# Patient Record
Sex: Female | Born: 1980 | Race: White | Hispanic: No | Marital: Single | Smoking: Never smoker
Health system: Southern US, Community
[De-identification: ages and names within clinical notes are randomized; demographics above are authoritative.]

## PROBLEM LIST (undated history)

## (undated) DIAGNOSIS — F32A Depression, unspecified: Secondary | ICD-10-CM

## (undated) DIAGNOSIS — F419 Anxiety disorder, unspecified: Secondary | ICD-10-CM

## (undated) DIAGNOSIS — F329 Major depressive disorder, single episode, unspecified: Secondary | ICD-10-CM

---

## 2016-06-23 ENCOUNTER — Encounter (HOSPITAL_BASED_OUTPATIENT_CLINIC_OR_DEPARTMENT_OTHER): Payer: Self-pay

## 2016-06-23 ENCOUNTER — Emergency Department (HOSPITAL_BASED_OUTPATIENT_CLINIC_OR_DEPARTMENT_OTHER)
Admission: EM | Admit: 2016-06-23 | Discharge: 2016-06-24 | Disposition: A | Discharge status: Home / Self Care | Payer: 59 | Attending: Emergency Medicine | Admitting: Emergency Medicine

## 2016-06-23 ENCOUNTER — Emergency Department (HOSPITAL_BASED_OUTPATIENT_CLINIC_OR_DEPARTMENT_OTHER): Payer: 59

## 2016-06-23 DIAGNOSIS — R05 Cough: Secondary | ICD-10-CM | POA: Insufficient documentation

## 2016-06-23 DIAGNOSIS — R112 Nausea with vomiting, unspecified: Secondary | ICD-10-CM

## 2016-06-23 DIAGNOSIS — R69 Illness, unspecified: Secondary | ICD-10-CM

## 2016-06-23 DIAGNOSIS — E860 Dehydration: Secondary | ICD-10-CM | POA: Diagnosis not present

## 2016-06-23 DIAGNOSIS — J111 Influenza due to unidentified influenza virus with other respiratory manifestations: Secondary | ICD-10-CM

## 2016-06-23 HISTORY — DX: Anxiety disorder, unspecified: F41.9

## 2016-06-23 HISTORY — DX: Major depressive disorder, single episode, unspecified: F32.9

## 2016-06-23 HISTORY — DX: Depression, unspecified: F32.A

## 2016-06-23 LAB — BASIC METABOLIC PANEL
ANION GAP: 9 (ref 5–15)
BUN: 18 mg/dL (ref 6–20)
CALCIUM: 8.7 mg/dL — AB (ref 8.9–10.3)
CHLORIDE: 101 mmol/L (ref 101–111)
CO2: 24 mmol/L (ref 22–32)
Creatinine, Ser: 1.25 mg/dL — ABNORMAL HIGH (ref 0.44–1.00)
GFR calc Af Amer: >60 mL/min (ref >60)
GFR calc non Af Amer: 55 mL/min — ABNORMAL LOW (ref >60)
Glucose, Bld: 150 mg/dL — ABNORMAL HIGH (ref 65–99)
Potassium: 3.7 mmol/L (ref 3.5–5.1)
Sodium: 134 mmol/L — ABNORMAL LOW (ref 135–145)

## 2016-06-23 LAB — CBC
HEMATOCRIT: 34.7 % — AB (ref 36.0–46.0)
HEMOGLOBIN: 11.7 g/dL — AB (ref 12.0–15.0)
MCH: 27.9 pg (ref 26.0–34.0)
MCHC: 33.7 g/dL (ref 30.0–36.0)
MCV: 82.8 fL (ref 78.0–100.0)
Platelets: 215 10*3/uL (ref 150–400)
RBC: 4.19 MIL/uL (ref 3.87–5.11)
RDW: 13 % (ref 11.5–15.5)
WBC: 13.7 10*3/uL — AB (ref 4.0–10.5)

## 2016-06-23 MED ORDER — OSELTAMIVIR PHOSPHATE 75 MG PO CAPS: 75.0000 mg | ORAL_CAPSULE | Freq: Two times a day (BID) | ORAL | 0 refills | Status: AC

## 2016-06-23 MED ORDER — SODIUM CHLORIDE 0.9 % IV BOLUS (SEPSIS)
1000.0000 mL | Freq: Once | INTRAVENOUS | Status: AC
  Administered 2016-06-23: 1000 mL via INTRAVENOUS

## 2016-06-23 MED ORDER — ACETAMINOPHEN 160 MG/5ML PO SOLN
650.0000 mg | Freq: Once | ORAL | Status: AC
Start: 2016-06-23 — End: 2016-06-23
  Administered 2016-06-23: 650 mg via ORAL
  Filled 2016-06-23: qty 20.3

## 2016-06-23 MED ORDER — ONDANSETRON 8 MG PO TBDP: 8.0000 mg | ORAL_TABLET | Freq: Three times a day (TID) | ORAL | 0 refills | Status: AC | PRN

## 2016-06-23 MED ORDER — ONDANSETRON 4 MG PO TBDP
4.0000 mg | ORAL_TABLET | Freq: Once | ORAL | Status: AC
  Administered 2016-06-23: 4 mg via ORAL
  Filled 2016-06-23: qty 1

## 2016-06-23 NOTE — Discharge Instructions (Signed)
Drink plenty of fluids, follow up with your primary care doctor if not better in the next week

## 2016-06-23 NOTE — ED Provider Notes (Signed)
[facility] Note   CSN: [id number] Arrival date & time: 06/23/16  2036  By signing my name below, I, Sonum Patel, attest that this documentation has been prepared under the direction and in the presence of Linwood DibblesJon Noya Santarelli, MD. Electronically Signed: Sonum Patel, Neurosurgeoncribe. 06/23/16. 10:08 PM.  History   Chief Complaint Chief Complaint  Patient presents with  . Emesis   The history is provided by the patient. No language interpreter was used.    HPI Comments: Tracy Rivers is a 36 y.o. female who presents to the Emergency Department complaining of an unchanged fever (TMAX 104.1) and chills that began earlier this evening. She notes associated cough and generalized myalgias that began earlier today. She also complains of vomiting PTA. She denies abdominal pain or diarrhea.    Past Medical History:  Diagnosis Date  . Anxiety   . Depression     There are no active problems to display for this patient.   History reviewed. No pertinent surgical history.  OB History    No data available       Home Medications    Prior to Admission medications   Medication Sig Start Date End Date Taking? Authorizing Provider  CITALOPRAM HYDROBROMIDE PO Take by mouth.   Yes Historical Provider, MD  ondansetron (ZOFRAN ODT) 8 MG disintegrating tablet Take 1 tablet (8 mg total) by mouth every 8 (eight) hours as needed for nausea or vomiting. 06/23/16   Linwood DibblesJon Tristina Sahagian, MD  oseltamivir (TAMIFLU) 75 MG capsule Take 1 capsule (75 mg total) by mouth 2 (two) times daily. 06/23/16   Linwood DibblesJon Tambi Thole, MD    Family History No family history on file.  Social History Social History  Substance Use Topics  . Smoking status: Never Smoker  . Smokeless tobacco: Never Used  . Alcohol use Yes     Comment: occ     Allergies   Mango flavor   Review of Systems Review of Systems  Constitutional: Positive for chills and fever.  HENT: Positive for congestion.   Respiratory: Positive for cough.     Gastrointestinal: Positive for vomiting. Negative for abdominal pain and diarrhea.     Physical Exam Updated Vital Signs BP 103/64 (BP Location: Left Arm)   Pulse (!) 131   Temp (!) 103.3 F (39.6 C) (Oral)   Resp 24   Ht 5' 4.75" (1.645 m)   Wt 88.5 kg   LMP 06/20/2016   SpO2 97%   BMI 32.70 kg/m   Physical Exam  Constitutional: She appears well-developed and well-nourished. No distress.  HENT:  Head: Normocephalic and atraumatic.  Right Ear: External ear normal.  Left Ear: External ear normal.  Eyes: Conjunctivae are normal. Right eye exhibits no discharge. Left eye exhibits no discharge. No scleral icterus.  Neck: Neck supple. No tracheal deviation present.  Cardiovascular: Normal rate, regular rhythm and intact distal pulses.   Pulmonary/Chest: Effort normal and breath sounds normal. No stridor. No respiratory distress. She has no wheezes. She has no rales.  Abdominal: Soft. Bowel sounds are normal. She exhibits no distension. There is no tenderness. There is no rebound and no guarding.  Musculoskeletal: She exhibits no edema or tenderness.  Neurological: She is alert. She has normal strength. No cranial nerve deficit (no facial droop, extraocular movements intact, no slurred speech) or sensory deficit. She exhibits normal muscle tone. She displays no seizure activity. Coordination normal.  Skin: Skin is warm and dry. No rash noted.  Psychiatric: She has a normal mood  and affect.  Nursing note and vitals reviewed.    ED Treatments / Results  DIAGNOSTIC STUDIES: Oxygen Saturation is 97% on RA, normal by my interpretation.    COORDINATION OF CARE: 10:07 PM Discussed treatment plan with pt at bedside and pt agreed to plan.   Labs (all labs ordered are listed, but only abnormal results are displayed) Labs Reviewed  CBC - Abnormal; Notable for the following:       Result Value   WBC 13.7 (*)    Hemoglobin 11.7 (*)    HCT 34.7 (*)    All other components within  normal limits  BASIC METABOLIC PANEL - Abnormal; Notable for the following:    Sodium 134 (*)    Glucose, Bld 150 (*)    Creatinine, Ser 1.25 (*)    Calcium 8.7 (*)    GFR calc non Af Amer 55 (*)    All other components within normal limits    Radiology Dg Chest 2 View  Result Date: 06/23/2016 CLINICAL DATA:  36 y/o  F; fever and chills. EXAM: CHEST  2 VIEW COMPARISON:  None. FINDINGS: The heart size and mediastinal contours are within normal limits. Bronchitic changes in the right lung base. No focal consolidation or pleural effusion. The visualized skeletal structures are unremarkable. IMPRESSION: Bronchitic changes in the right lung base. No focal consolidation or pleural effusion. Electronically Signed   By: Mitzi Hansen M.D.   On: 06/23/2016 22:25    Procedures Procedures (including critical care time)  Medications Ordered in ED Medications  sodium chloride 0.9 % bolus 1,000 mL (not administered)  ondansetron (ZOFRAN-ODT) disintegrating tablet 4 mg (4 mg Oral Given 06/23/16 2106)  acetaminophen (TYLENOL) solution 650 mg (650 mg Oral Given 06/23/16 2105)  sodium chloride 0.9 % bolus 1,000 mL (0 mLs Intravenous Stopped 06/23/16 2350)     Initial Impression / Assessment and Plan / ED Course  I have reviewed the triage vital signs and the nursing notes.  Pertinent labs & imaging results that were available during my care of the patient were reviewed by me and considered in my medical decision making (see chart for details).   Sx are consistent with influenza like illness.  No pna noted on cxr.  Labs do show mild AKI related to her vomiting.  Pt was given IV fluids and no further vomiting.  Dc home, supportive care.  Final Clinical Impressions(s) / ED Diagnoses   Final diagnoses:  Influenza-like illness  Nausea and vomiting, intractability of vomiting not specified, unspecified vomiting type  Dehydration    New Prescriptions New Prescriptions   ONDANSETRON  (ZOFRAN ODT) 8 MG DISINTEGRATING TABLET    Take 1 tablet (8 mg total) by mouth every 8 (eight) hours as needed for nausea or vomiting.   OSELTAMIVIR (TAMIFLU) 75 MG CAPSULE    Take 1 capsule (75 mg total) by mouth 2 (two) times daily.   I personally performed the services described in this documentation, which was scribed in my presence.  The recorded information has been reviewed and is accurate.    Linwood Dibbles, MD 06/23/16 [phone]

## 2016-06-23 NOTE — ED Triage Notes (Addendum)
C/o n/v this pm-fever and body aches this am-vomiting in triage-NAD after-steady gait

## 2017-03-18 IMAGING — CR DG CHEST 2V
2 series · 2 of 2 positions shown · non-contrast
Comparison: None.

CLINICAL DATA: 35 y/o  F; fever and chills.

EXAM:
CHEST  2 VIEW

[w chest pa]
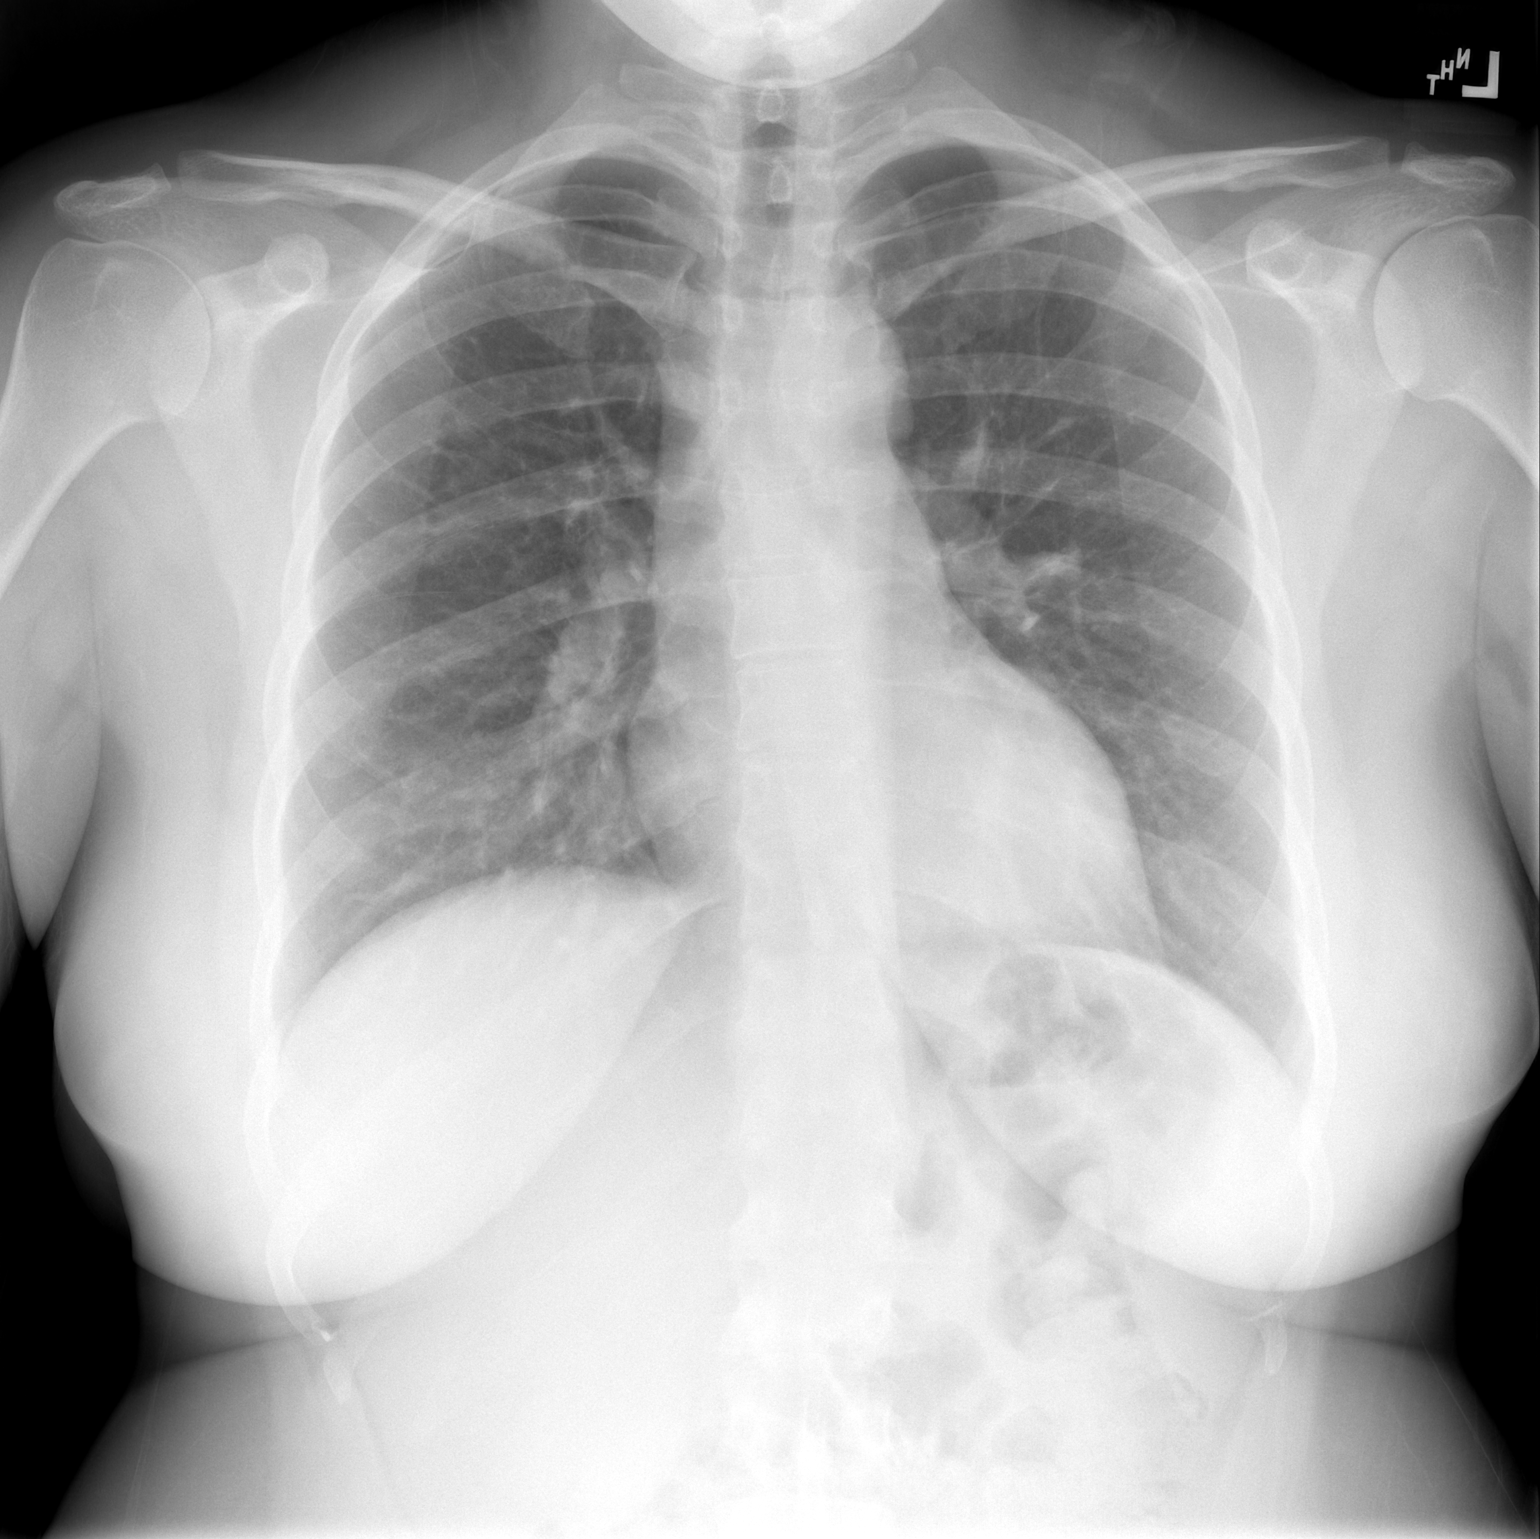

[w chest lat]
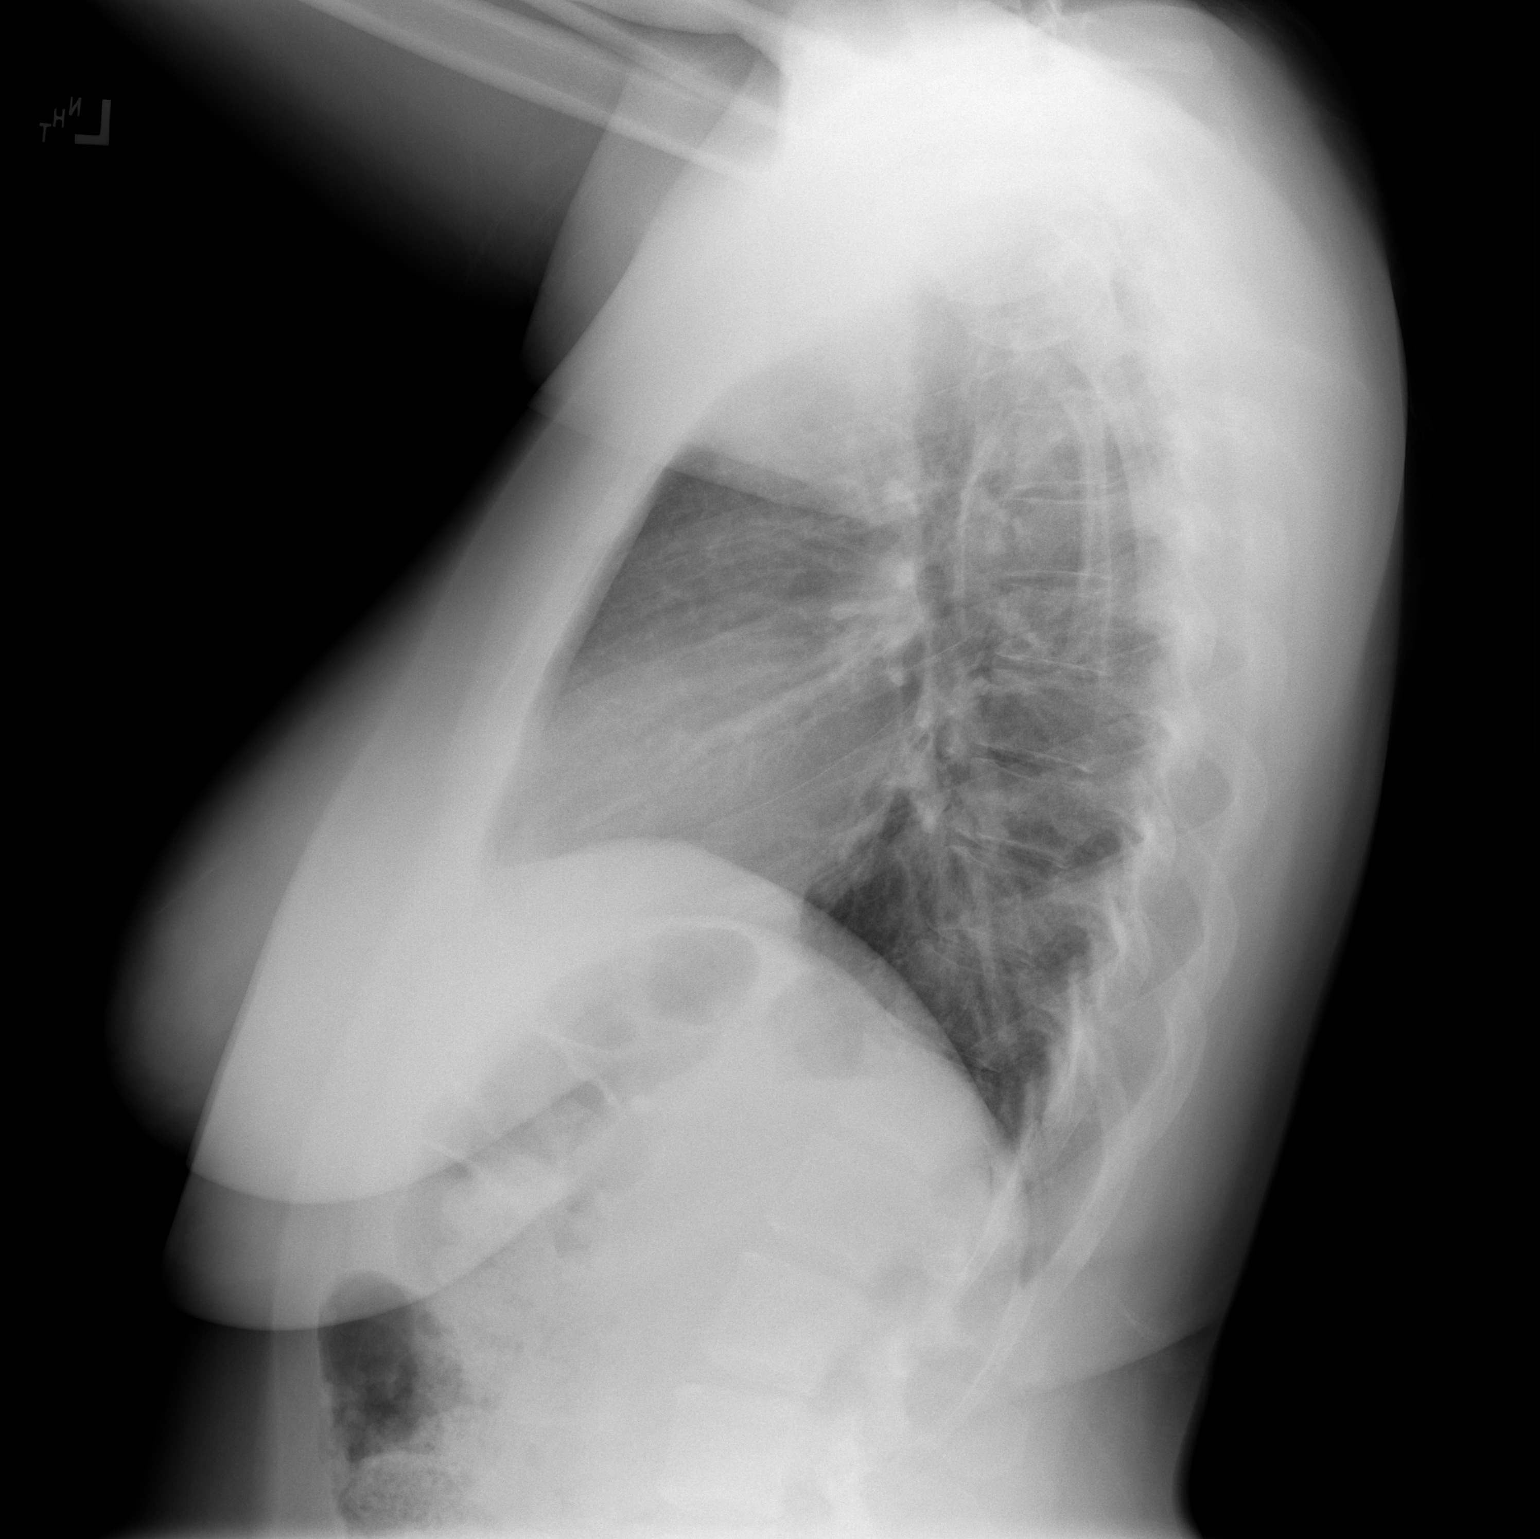

[2 of 2 positions shown; findings below may reference images not displayed]

FINDINGS: The heart size and mediastinal contours are within normal limits.
Bronchitic changes in the right lung base. No focal consolidation or
pleural effusion. The visualized skeletal structures are
unremarkable.
IMPRESSION: Bronchitic changes in the right lung base. No focal consolidation or
pleural effusion.

By: Blur Ivy M.D.
